# Patient Record
Sex: Male | Born: 1980 | Race: Black or African American | Hispanic: No | Marital: Single | Smoking: Current every day smoker
Health system: Southern US, Community
[De-identification: ages and names within clinical notes are randomized; demographics above are authoritative.]

---
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
## 2004-01-20 ENCOUNTER — Emergency Department (HOSPITAL_COMMUNITY): Admission: EM | Admit: 2004-01-20 | Discharge: 2004-01-20 | Payer: Self-pay | Admitting: Emergency Medicine

## 2005-01-14 ENCOUNTER — Emergency Department (HOSPITAL_COMMUNITY): Admission: EM | Admit: 2005-01-14 | Discharge: 2005-01-14 | Payer: Self-pay | Admitting: Emergency Medicine

## 2005-03-31 ENCOUNTER — Emergency Department (HOSPITAL_COMMUNITY): Admission: EM | Admit: 2005-03-31 | Discharge: 2005-03-31 | Payer: Self-pay | Admitting: Emergency Medicine

## 2005-05-04 ENCOUNTER — Encounter: Admission: RE | Admit: 2005-05-04 | Discharge: 2005-05-04 | Payer: Self-pay | Admitting: Occupational Medicine

## 2005-12-22 ENCOUNTER — Emergency Department: Payer: Self-pay | Admitting: Emergency Medicine

## 2011-06-03 ENCOUNTER — Emergency Department: Payer: Self-pay | Admitting: Emergency Medicine

## 2014-03-04 ENCOUNTER — Emergency Department: Payer: Self-pay | Admitting: Emergency Medicine

## 2018-03-10 ENCOUNTER — Emergency Department: Payer: Self-pay

## 2018-03-10 ENCOUNTER — Encounter: Payer: Self-pay | Admitting: Intensive Care

## 2018-03-10 ENCOUNTER — Emergency Department
Admission: EM | Admit: 2018-03-10 | Discharge: 2018-03-10 | Disposition: A | Discharge status: Home / Self Care | Payer: Self-pay | Attending: Emergency Medicine | Admitting: Emergency Medicine

## 2018-03-10 ENCOUNTER — Other Ambulatory Visit: Payer: Self-pay

## 2018-03-10 DIAGNOSIS — F17210 Nicotine dependence, cigarettes, uncomplicated: Secondary | ICD-10-CM | POA: Insufficient documentation

## 2018-03-10 DIAGNOSIS — Y999 Unspecified external cause status: Secondary | ICD-10-CM | POA: Insufficient documentation

## 2018-03-10 DIAGNOSIS — S80212A Abrasion, left knee, initial encounter: Secondary | ICD-10-CM | POA: Insufficient documentation

## 2018-03-10 DIAGNOSIS — M25462 Effusion, left knee: Secondary | ICD-10-CM | POA: Insufficient documentation

## 2018-03-10 DIAGNOSIS — Z23 Encounter for immunization: Secondary | ICD-10-CM | POA: Insufficient documentation

## 2018-03-10 DIAGNOSIS — T148XXA Other injury of unspecified body region, initial encounter: Secondary | ICD-10-CM

## 2018-03-10 DIAGNOSIS — Y929 Unspecified place or not applicable: Secondary | ICD-10-CM | POA: Insufficient documentation

## 2018-03-10 DIAGNOSIS — Y9389 Activity, other specified: Secondary | ICD-10-CM | POA: Insufficient documentation

## 2018-03-10 MED ORDER — TETANUS-DIPHTH-ACELL PERTUSSIS 5-2.5-18.5 LF-MCG/0.5 IM SUSP
0.5000 mL | Freq: Once | INTRAMUSCULAR | Status: AC
  Administered 2018-03-10: 0.5 mL via INTRAMUSCULAR
  Filled 2018-03-10: qty 0.5

## 2018-03-10 MED ORDER — CEPHALEXIN 500 MG PO CAPS: 500.0000 mg | ORAL_CAPSULE | Freq: Three times a day (TID) | ORAL | 0 refills | Status: AC
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
## 2018-03-10 NOTE — ED Notes (Signed)
Wound cleaned with 0.9 normal saline and dressed with a non adhesive dressing. Patient tolerated well.
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
## 2018-03-10 NOTE — Discharge Instructions (Signed)
Follow-up with your regular doctor if not better in 3 to 5 days.  Or you can call Dr. Rosita KeaMenz for an appointment.  Watch the abrasion area closely for infection.  Begin a prescription for Keflex.  Please take this as instructed.  You may take Tylenol and ibuprofen as needed for pain.  Apply ice to the knee.  Return if worsening.
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
